# Patient Record
Sex: Female | Born: 1965 | Smoking: Never smoker
Health system: Southern US, Community
[De-identification: ages and names within clinical notes are randomized; demographics above are authoritative.]

## PROBLEM LIST (undated history)

## (undated) DIAGNOSIS — R51 Headache: Secondary | ICD-10-CM

## (undated) DIAGNOSIS — K219 Gastro-esophageal reflux disease without esophagitis: Secondary | ICD-10-CM

## (undated) DIAGNOSIS — C801 Malignant (primary) neoplasm, unspecified: Secondary | ICD-10-CM

## (undated) DIAGNOSIS — F419 Anxiety disorder, unspecified: Secondary | ICD-10-CM

## (undated) DIAGNOSIS — Z720 Tobacco use: Secondary | ICD-10-CM

## (undated) DIAGNOSIS — M1990 Unspecified osteoarthritis, unspecified site: Secondary | ICD-10-CM

## (undated) DIAGNOSIS — F329 Major depressive disorder, single episode, unspecified: Secondary | ICD-10-CM

## (undated) DIAGNOSIS — F32A Depression, unspecified: Secondary | ICD-10-CM

## (undated) DIAGNOSIS — F319 Bipolar disorder, unspecified: Secondary | ICD-10-CM

## (undated) DIAGNOSIS — Z789 Other specified health status: Secondary | ICD-10-CM

## (undated) DIAGNOSIS — R519 Headache, unspecified: Secondary | ICD-10-CM

## (undated) HISTORY — PX: ENDOMETRIAL ABLATION: SHX621

## (undated) HISTORY — PX: BUNIONECTOMY: SHX129

---

## 2017-03-27 ENCOUNTER — Other Ambulatory Visit: Payer: Self-pay | Admitting: General Surgery

## 2017-06-07 ENCOUNTER — Ambulatory Visit: Payer: Self-pay | Admitting: General Surgery

## 2017-06-07 DIAGNOSIS — C50512 Malignant neoplasm of lower-outer quadrant of left female breast: Secondary | ICD-10-CM

## 2017-06-07 DIAGNOSIS — Z170 Estrogen receptor positive status [ER+]: Principal | ICD-10-CM

## 2017-06-24 ENCOUNTER — Other Ambulatory Visit: Payer: Self-pay | Admitting: General Surgery

## 2017-06-24 ENCOUNTER — Encounter (HOSPITAL_BASED_OUTPATIENT_CLINIC_OR_DEPARTMENT_OTHER): Payer: Self-pay | Admitting: *Deleted

## 2017-06-24 DIAGNOSIS — C801 Malignant (primary) neoplasm, unspecified: Secondary | ICD-10-CM

## 2017-06-24 DIAGNOSIS — C50512 Malignant neoplasm of lower-outer quadrant of left female breast: Secondary | ICD-10-CM

## 2017-06-24 DIAGNOSIS — Z170 Estrogen receptor positive status [ER+]: Principal | ICD-10-CM

## 2017-06-24 HISTORY — DX: Malignant (primary) neoplasm, unspecified: C80.1

## 2017-06-26 ENCOUNTER — Ambulatory Visit
Admission: RE | Admit: 2017-06-26 | Discharge: 2017-06-26 | Disposition: A | Discharge status: Home / Self Care | Payer: Medicare Other | Source: Ambulatory Visit | Attending: General Surgery | Admitting: General Surgery

## 2017-06-26 ENCOUNTER — Other Ambulatory Visit: Payer: Self-pay | Admitting: General Surgery

## 2017-06-26 DIAGNOSIS — Z170 Estrogen receptor positive status [ER+]: Principal | ICD-10-CM

## 2017-06-26 DIAGNOSIS — C50512 Malignant neoplasm of lower-outer quadrant of left female breast: Secondary | ICD-10-CM

## 2017-06-26 NOTE — Progress Notes (Signed)
Ensure pre surgery drink given with instructions to complete by Andrews, hibiclens soap given with instructions.

## 2017-06-27 NOTE — Anesthesia Preprocedure Evaluation (Addendum)
Anesthesia Evaluation  Patient identified by MRN, date of birth, ID band Patient awake    Reviewed: Allergy & Precautions, NPO status , Patient's Chart, lab work & pertinent test results  History of Anesthesia Complications Negative for: history of anesthetic complications  Airway Mallampati: II  TM Distance: >3 FB Neck ROM: Full    Dental  (+) Dental Advisory Given   Pulmonary neg pulmonary ROS,    breath sounds clear to auscultation       Cardiovascular negative cardio ROS   Rhythm:Regular Rate:Normal     Neuro/Psych  Headaches, PSYCHIATRIC DISORDERS Anxiety Depression Bipolar Disorder negative neurological ROS     GI/Hepatic Neg liver ROS, GERD  Medicated and Controlled,  Endo/Other  Morbid obesity  Renal/GU negative Renal ROS     Musculoskeletal   Abdominal (+) + obese,   Peds  Hematology negative hematology ROS (+)   Anesthesia Other Findings Breast cancer  Reproductive/Obstetrics                            Anesthesia Physical Anesthesia Plan  ASA: II  Anesthesia Plan: General   Post-op Pain Management: GA combined w/ Regional for post-op pain   Induction: Intravenous  PONV Risk Score and Plan: 3 and Ondansetron, Dexamethasone, Midazolam and Scopolamine patch - Pre-op  Airway Management Planned: LMA  Additional Equipment:   Intra-op Plan:   Post-operative Plan:   Informed Consent: I have reviewed the patients History and Physical, chart, labs and discussed the procedure including the risks, benefits and alternatives for the proposed anesthesia with the patient or authorized representative who has indicated his/her understanding and acceptance.   Dental advisory given  Plan Discussed with: CRNA and Surgeon  Anesthesia Plan Comments: (Plan routine monitors, GA- LMA OK Pectoralis block for post op analgesia)        Anesthesia Quick Evaluation

## 2017-06-28 ENCOUNTER — Encounter (HOSPITAL_BASED_OUTPATIENT_CLINIC_OR_DEPARTMENT_OTHER): Payer: Self-pay | Admitting: Certified Registered"

## 2017-06-28 ENCOUNTER — Ambulatory Visit (HOSPITAL_BASED_OUTPATIENT_CLINIC_OR_DEPARTMENT_OTHER): Payer: Medicare Other | Admitting: Anesthesiology

## 2017-06-28 ENCOUNTER — Encounter (HOSPITAL_COMMUNITY)
Admission: RE | Admit: 2017-06-28 | Discharge: 2017-06-28 | Disposition: A | Discharge status: Home / Self Care | Payer: Medicare Other | Source: Ambulatory Visit | Attending: General Surgery | Admitting: General Surgery

## 2017-06-28 ENCOUNTER — Other Ambulatory Visit: Payer: Self-pay

## 2017-06-28 ENCOUNTER — Ambulatory Visit (HOSPITAL_BASED_OUTPATIENT_CLINIC_OR_DEPARTMENT_OTHER)
Admission: RE | Admit: 2017-06-28 | Discharge: 2017-06-28 | Disposition: A | Discharge status: Home / Self Care | Payer: Medicare Other | Source: Ambulatory Visit | Attending: General Surgery | Admitting: General Surgery

## 2017-06-28 ENCOUNTER — Encounter (HOSPITAL_BASED_OUTPATIENT_CLINIC_OR_DEPARTMENT_OTHER)
Admission: RE | Disposition: A | Discharge status: Home / Self Care | Payer: Self-pay | Source: Ambulatory Visit | Attending: General Surgery

## 2017-06-28 ENCOUNTER — Ambulatory Visit
Admission: RE | Admit: 2017-06-28 | Discharge: 2017-06-28 | Disposition: A | Discharge status: Home / Self Care | Payer: Medicare Other | Source: Ambulatory Visit | Attending: General Surgery | Admitting: General Surgery

## 2017-06-28 DIAGNOSIS — Z87891 Personal history of nicotine dependence: Secondary | ICD-10-CM | POA: Insufficient documentation

## 2017-06-28 DIAGNOSIS — Z170 Estrogen receptor positive status [ER+]: Principal | ICD-10-CM

## 2017-06-28 DIAGNOSIS — C50912 Malignant neoplasm of unspecified site of left female breast: Secondary | ICD-10-CM | POA: Diagnosis present

## 2017-06-28 DIAGNOSIS — Z6833 Body mass index (BMI) 33.0-33.9, adult: Secondary | ICD-10-CM | POA: Diagnosis not present

## 2017-06-28 DIAGNOSIS — F419 Anxiety disorder, unspecified: Secondary | ICD-10-CM | POA: Insufficient documentation

## 2017-06-28 DIAGNOSIS — Z79899 Other long term (current) drug therapy: Secondary | ICD-10-CM | POA: Insufficient documentation

## 2017-06-28 DIAGNOSIS — C773 Secondary and unspecified malignant neoplasm of axilla and upper limb lymph nodes: Secondary | ICD-10-CM | POA: Insufficient documentation

## 2017-06-28 DIAGNOSIS — C50512 Malignant neoplasm of lower-outer quadrant of left female breast: Secondary | ICD-10-CM

## 2017-06-28 DIAGNOSIS — K219 Gastro-esophageal reflux disease without esophagitis: Secondary | ICD-10-CM | POA: Insufficient documentation

## 2017-06-28 HISTORY — DX: Tobacco use: Z72.0

## 2017-06-28 HISTORY — DX: Other specified health status: Z78.9

## 2017-06-28 HISTORY — DX: Anxiety disorder, unspecified: F41.9

## 2017-06-28 HISTORY — PX: BREAST LUMPECTOMY WITH RADIOACTIVE SEED AND SENTINEL LYMPH NODE BIOPSY: SHX6550

## 2017-06-28 HISTORY — DX: Depression, unspecified: F32.A

## 2017-06-28 HISTORY — DX: Gastro-esophageal reflux disease without esophagitis: K21.9

## 2017-06-28 HISTORY — DX: Major depressive disorder, single episode, unspecified: F32.9

## 2017-06-28 HISTORY — DX: Unspecified osteoarthritis, unspecified site: M19.90

## 2017-06-28 HISTORY — DX: Headache: R51

## 2017-06-28 HISTORY — DX: Bipolar disorder, unspecified: F31.9

## 2017-06-28 HISTORY — DX: Headache, unspecified: R51.9

## 2017-06-28 HISTORY — DX: Malignant (primary) neoplasm, unspecified: C80.1

## 2017-06-28 LAB — POCT HEMOGLOBIN-HEMACUE: Hemoglobin: 11.7 g/dL — ABNORMAL LOW (ref 12.0–15.0)

## 2017-06-28 SURGERY — BREAST LUMPECTOMY WITH RADIOACTIVE SEED AND SENTINEL LYMPH NODE BIOPSY
Anesthesia: General | Site: Breast | Laterality: Left

## 2017-06-28 MED ORDER — EPHEDRINE SULFATE 50 MG/ML IJ SOLN
INTRAMUSCULAR | Status: DC | PRN
  Administered 2017-06-28 (×4): 10 mg via INTRAVENOUS

## 2017-06-28 MED ORDER — LIDOCAINE 2% (20 MG/ML) 5 ML SYRINGE
INTRAMUSCULAR | Status: AC
  Filled 2017-06-28: qty 10

## 2017-06-28 MED ORDER — BUPIVACAINE-EPINEPHRINE (PF) 0.25% -1:200000 IJ SOLN
INTRAMUSCULAR | Status: AC
  Filled 2017-06-28: qty 30

## 2017-06-28 MED ORDER — BUPIVACAINE-EPINEPHRINE (PF) 0.5% -1:200000 IJ SOLN
INTRAMUSCULAR | Status: DC | PRN
  Administered 2017-06-28: 30 mL

## 2017-06-28 MED ORDER — CELECOXIB 200 MG PO CAPS
200.0000 mg | ORAL_CAPSULE | ORAL | Status: AC
  Administered 2017-06-28: 200 mg via ORAL

## 2017-06-28 MED ORDER — LIDOCAINE HCL (CARDIAC) 20 MG/ML IV SOLN
INTRAVENOUS | Status: DC | PRN
  Administered 2017-06-28: 30 mg via INTRAVENOUS
  Administered 2017-06-28: 50 mg via INTRAVENOUS

## 2017-06-28 MED ORDER — SCOPOLAMINE 1 MG/3DAYS TD PT72: 1.0000 | MEDICATED_PATCH | Freq: Once | TRANSDERMAL | Status: DC | PRN

## 2017-06-28 MED ORDER — TECHNETIUM TC 99M SULFUR COLLOID FILTERED
1.0000 | Freq: Once | INTRAVENOUS | Status: AC | PRN
  Administered 2017-06-28: 1 via INTRADERMAL

## 2017-06-28 MED ORDER — PROPOFOL 10 MG/ML IV BOLUS
INTRAVENOUS | Status: DC | PRN
  Administered 2017-06-28: 200 mg via INTRAVENOUS

## 2017-06-28 MED ORDER — GABAPENTIN 300 MG PO CAPS
300.0000 mg | ORAL_CAPSULE | ORAL | Status: AC
  Administered 2017-06-28: 300 mg via ORAL

## 2017-06-28 MED ORDER — FENTANYL CITRATE (PF) 100 MCG/2ML IJ SOLN
INTRAMUSCULAR | Status: AC
  Filled 2017-06-28: qty 2

## 2017-06-28 MED ORDER — ACETAMINOPHEN 500 MG PO TABS
1000.0000 mg | ORAL_TABLET | ORAL | Status: AC
  Administered 2017-06-28: 1000 mg via ORAL

## 2017-06-28 MED ORDER — DEXAMETHASONE SODIUM PHOSPHATE 4 MG/ML IJ SOLN
INTRAMUSCULAR | Status: DC | PRN
  Administered 2017-06-28: 10 mg via INTRAVENOUS

## 2017-06-28 MED ORDER — FENTANYL CITRATE (PF) 100 MCG/2ML IJ SOLN
50.0000 ug | INTRAMUSCULAR | Status: DC | PRN
  Administered 2017-06-28: 100 ug via INTRAVENOUS

## 2017-06-28 MED ORDER — MIDAZOLAM HCL 2 MG/2ML IJ SOLN: 0.5000 mg | Freq: Once | INTRAMUSCULAR | Status: DC | PRN

## 2017-06-28 MED ORDER — FENTANYL CITRATE (PF) 100 MCG/2ML IJ SOLN
INTRAMUSCULAR | Status: DC | PRN
  Administered 2017-06-28: 50 ug via INTRAVENOUS

## 2017-06-28 MED ORDER — CEFAZOLIN SODIUM-DEXTROSE 2-4 GM/100ML-% IV SOLN
2.0000 g | INTRAVENOUS | Status: AC
  Administered 2017-06-28 (×2): 2 g via INTRAVENOUS

## 2017-06-28 MED ORDER — MIDAZOLAM HCL 2 MG/2ML IJ SOLN
INTRAMUSCULAR | Status: AC
  Filled 2017-06-28: qty 2

## 2017-06-28 MED ORDER — BUPIVACAINE HCL (PF) 0.25 % IJ SOLN
INTRAMUSCULAR | Status: DC | PRN
  Administered 2017-06-28: 7 mL

## 2017-06-28 MED ORDER — BUPIVACAINE HCL (PF) 0.25 % IJ SOLN
INTRAMUSCULAR | Status: AC
  Filled 2017-06-28: qty 30

## 2017-06-28 MED ORDER — PROPOFOL 500 MG/50ML IV EMUL
INTRAVENOUS | Status: AC
  Filled 2017-06-28: qty 50

## 2017-06-28 MED ORDER — ONDANSETRON HCL 4 MG/2ML IJ SOLN
INTRAMUSCULAR | Status: DC | PRN
  Administered 2017-06-28: 4 mg via INTRAVENOUS

## 2017-06-28 MED ORDER — ONDANSETRON HCL 4 MG/2ML IJ SOLN
INTRAMUSCULAR | Status: AC
  Filled 2017-06-28: qty 8

## 2017-06-28 MED ORDER — MEPERIDINE HCL 25 MG/ML IJ SOLN: 6.2500 mg | INTRAMUSCULAR | Status: DC | PRN

## 2017-06-28 MED ORDER — LACTATED RINGERS IV SOLN
INTRAVENOUS | Status: DC
  Administered 2017-06-28 (×2): via INTRAVENOUS

## 2017-06-28 MED ORDER — ENSURE PRE-SURGERY PO LIQD: 592.0000 mL | Freq: Once | ORAL | Status: DC

## 2017-06-28 MED ORDER — OXYCODONE HCL 5 MG PO TABS: 5.0000 mg | ORAL_TABLET | Freq: Four times a day (QID) | ORAL | 0 refills | Status: AC | PRN

## 2017-06-28 MED ORDER — DEXAMETHASONE SODIUM PHOSPHATE 10 MG/ML IJ SOLN
INTRAMUSCULAR | Status: AC
  Filled 2017-06-28: qty 2

## 2017-06-28 MED ORDER — SODIUM CHLORIDE 0.9 % IJ SOLN
INTRAMUSCULAR | Status: AC
  Filled 2017-06-28: qty 10

## 2017-06-28 MED ORDER — MIDAZOLAM HCL 2 MG/2ML IJ SOLN
1.0000 mg | INTRAMUSCULAR | Status: DC | PRN
  Administered 2017-06-28: 2 mg via INTRAVENOUS

## 2017-06-28 MED ORDER — FENTANYL CITRATE (PF) 100 MCG/2ML IJ SOLN: 25.0000 ug | INTRAMUSCULAR | Status: DC | PRN

## 2017-06-28 MED ORDER — EPHEDRINE 5 MG/ML INJ
INTRAVENOUS | Status: AC
  Filled 2017-06-28: qty 20

## 2017-06-28 MED ORDER — PROMETHAZINE HCL 25 MG/ML IJ SOLN: 6.2500 mg | INTRAMUSCULAR | Status: DC | PRN

## 2017-06-28 MED ORDER — HEMOSTATIC AGENTS (NO CHARGE) OPTIME
TOPICAL | Status: DC | PRN
  Administered 2017-06-28: 1 via TOPICAL

## 2017-06-28 MED ORDER — METHYLENE BLUE 0.5 % INJ SOLN
INTRAVENOUS | Status: AC
  Filled 2017-06-28: qty 10

## 2017-06-28 SURGICAL SUPPLY — 61 items
APPLIER CLIP 9.375 MED OPEN (MISCELLANEOUS) ×3
BINDER BREAST LRG (GAUZE/BANDAGES/DRESSINGS) IMPLANT
BINDER BREAST MEDIUM (GAUZE/BANDAGES/DRESSINGS) IMPLANT
BINDER BREAST XLRG (GAUZE/BANDAGES/DRESSINGS) ×3 IMPLANT
BINDER BREAST XXLRG (GAUZE/BANDAGES/DRESSINGS) IMPLANT
BLADE SURG 15 STRL LF DISP TIS (BLADE) ×1 IMPLANT
BLADE SURG 15 STRL SS (BLADE) ×2
CANISTER SUC SOCK COL 7IN (MISCELLANEOUS) IMPLANT
CANISTER SUCT 1200ML W/VALVE (MISCELLANEOUS) ×3 IMPLANT
CHLORAPREP W/TINT 26ML (MISCELLANEOUS) ×3 IMPLANT
CLIP APPLIE 9.375 MED OPEN (MISCELLANEOUS) ×1 IMPLANT
CLIP VESOCCLUDE SM WIDE 6/CT (CLIP) ×3 IMPLANT
CLOSURE WOUND 1/2 X4 (GAUZE/BANDAGES/DRESSINGS) ×1
COVER BACK TABLE 60X90IN (DRAPES) ×3 IMPLANT
COVER MAYO STAND STRL (DRAPES) ×3 IMPLANT
COVER PROBE W GEL 5X96 (DRAPES) ×3 IMPLANT
DECANTER SPIKE VIAL GLASS SM (MISCELLANEOUS) IMPLANT
DERMABOND ADVANCED (GAUZE/BANDAGES/DRESSINGS) ×2
DERMABOND ADVANCED .7 DNX12 (GAUZE/BANDAGES/DRESSINGS) ×1 IMPLANT
DEVICE DUBIN W/COMP PLATE 8390 (MISCELLANEOUS) ×3 IMPLANT
DRAPE LAPAROSCOPIC ABDOMINAL (DRAPES) ×3 IMPLANT
DRAPE UTILITY XL STRL (DRAPES) ×3 IMPLANT
ELECT COATED BLADE 2.86 ST (ELECTRODE) ×3 IMPLANT
ELECT REM PT RETURN 9FT ADLT (ELECTROSURGICAL) ×3
ELECTRODE REM PT RTRN 9FT ADLT (ELECTROSURGICAL) ×1 IMPLANT
GLOVE BIO SURGEON STRL SZ7 (GLOVE) ×9 IMPLANT
GLOVE BIOGEL PI IND STRL 7.5 (GLOVE) ×1 IMPLANT
GLOVE BIOGEL PI INDICATOR 7.5 (GLOVE) ×2
GLOVE EXAM NITRILE EXT CUFF MD (GLOVE) ×3 IMPLANT
GOWN STRL REUS W/ TWL LRG LVL3 (GOWN DISPOSABLE) ×1 IMPLANT
GOWN STRL REUS W/ TWL XL LVL3 (GOWN DISPOSABLE) ×1 IMPLANT
GOWN STRL REUS W/TWL LRG LVL3 (GOWN DISPOSABLE) ×2
GOWN STRL REUS W/TWL XL LVL3 (GOWN DISPOSABLE) ×2
HEMOSTAT ARISTA ABSORB 3G PWDR (MISCELLANEOUS) ×3 IMPLANT
ILLUMINATOR WAVEGUIDE N/F (MISCELLANEOUS) ×3 IMPLANT
KIT MARKER MARGIN INK (KITS) ×3 IMPLANT
LIGHT WAVEGUIDE WIDE FLAT (MISCELLANEOUS) IMPLANT
NDL SAFETY ECLIPSE 18X1.5 (NEEDLE) IMPLANT
NEEDLE HYPO 18GX1.5 SHARP (NEEDLE)
NEEDLE HYPO 25X1 1.5 SAFETY (NEEDLE) ×3 IMPLANT
NS IRRIG 1000ML POUR BTL (IV SOLUTION) IMPLANT
PACK BASIN DAY SURGERY FS (CUSTOM PROCEDURE TRAY) ×3 IMPLANT
PENCIL BUTTON HOLSTER BLD 10FT (ELECTRODE) ×3 IMPLANT
SLEEVE SCD COMPRESS KNEE MED (MISCELLANEOUS) ×3 IMPLANT
SPONGE LAP 4X18 X RAY DECT (DISPOSABLE) ×3 IMPLANT
STRIP CLOSURE SKIN 1/2X4 (GAUZE/BANDAGES/DRESSINGS) ×2 IMPLANT
SUT ETHILON 2 0 FS 18 (SUTURE) IMPLANT
SUT MNCRL AB 4-0 PS2 18 (SUTURE) ×6 IMPLANT
SUT MON AB 5-0 PS2 18 (SUTURE) IMPLANT
SUT SILK 2 0 SH (SUTURE) ×3 IMPLANT
SUT VIC AB 2-0 SH 27 (SUTURE) ×4
SUT VIC AB 2-0 SH 27XBRD (SUTURE) ×2 IMPLANT
SUT VIC AB 3-0 SH 27 (SUTURE) ×2
SUT VIC AB 3-0 SH 27X BRD (SUTURE) ×1 IMPLANT
SUT VIC AB 5-0 PS2 18 (SUTURE) IMPLANT
SYR CONTROL 10ML LL (SYRINGE) ×3 IMPLANT
TOWEL OR 17X24 6PK STRL BLUE (TOWEL DISPOSABLE) ×3 IMPLANT
TOWEL OR NON WOVEN STRL DISP B (DISPOSABLE) ×3 IMPLANT
TUBE CONNECTING 20'X1/4 (TUBING) ×1
TUBE CONNECTING 20X1/4 (TUBING) ×2 IMPLANT
YANKAUER SUCT BULB TIP NO VENT (SUCTIONS) ×3 IMPLANT

## 2017-06-28 NOTE — Anesthesia Postprocedure Evaluation (Signed)
Anesthesia Post Note  Patient: Haley Webb  Procedure(s) Performed: BREAST LUMPECTOMY WITH RADIOACTIVE SEED AND SENTINEL LYMPH NODE BIOPSY (Left Breast)     Patient location during evaluation: PACU Anesthesia Type: General Level of consciousness: awake and alert, patient cooperative and oriented Pain management: pain level controlled Vital Signs Assessment: post-procedure vital signs reviewed and stable Respiratory status: spontaneous breathing, nonlabored ventilation and respiratory function stable Cardiovascular status: blood pressure returned to baseline and stable Postop Assessment: no apparent nausea or vomiting and adequate PO intake Anesthetic complications: no    Last Vitals:  Vitals:   06/28/17 1026 06/28/17 1030  BP:  102/78  Pulse: (!) 105 (!) 106  Resp: 17 16  Temp:  37.1 C  SpO2: 93% 95%    Last Pain:  Vitals:   06/28/17 1030  TempSrc:   PainSc: 0-No pain                 Betul Brisky,E. Zola Runion

## 2017-06-28 NOTE — H&P (Signed)
51 yof referred by Dr Darrol Angel for newly diagnosed left breast cancer. She has bipolar and sees a psychiatrist regularly. she has had bilateral nipple dc in past that is better with change in medications. she has no personal or family history of breast disease. she underwent screening mm that shows an asymmetry in the left breast. on callback she was noted to have an inferolateral architectural distortion and superomedial she was noted to have a 1.6 cm mass. on [place] the 4 oclock lesion is a 4x5x3 mm mass and at 11 oclock there is a 1.7x0.8x1.1 cm mass. the left axillary nodes are negative. US biopsy of both masses was done. 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 the other biopsy is fibrocystic changes with prominent cyst and microcyst formation. she has since also undergone an mri that shows normal left axillary nodes, the biopsy proven cancer in loq with little enhancement at that site, nl right breast and nodes. she has since seen a couple surgeons, med onc and rad onc. she requested another opinion and is here today by herself she is on disability. she live with a roommate she is trying to have leave now. she has two sons who are near who help her. she states her psych status is pretty stable. she is very concerned about breast appearance moving forward   Past Surgical History Illene Regulus, CMA; 06/07/2017 3:05 PM) Breast Biopsy  Left. Cesarean Section - 1  Foot Surgery  Bilateral.  Diagnostic Studies History Illene Regulus, CMA; 06/07/2017 3:05 PM) Colonoscopy  never Mammogram  1-3 years ago  Allergies Malachy Moan, RMA; 06/07/2017 10:05 AM) No Known Allergies 06/07/2017  Medication History Malachy Moan, RMA; 06/07/2017 10:08 AM) CeleXA (Oral) Specific strength unknown - Active. HydroCHLOROthiazide (Oral) Specific strength unknown - Active. ClonazePAM (Oral) Specific strength unknown - Active. Doxepin HCl  (Oral) Specific strength unknown - Active. Ranitidine (Oral) Specific strength unknown - Active. PriLOSEC (Oral) Specific strength unknown - Active. Medications Reconciled  Social History Illene Regulus, CMA; 06/07/2017 3:05 PM) Alcohol use  Occasional alcohol use. Caffeine use  Carbonated beverages, Coffee. No drug use  Tobacco use  Former smoker.  Family History Illene Regulus, CMA; 06/07/2017 3:05 PM) Family history unknown  First Degree Relatives   Pregnancy / Birth History Illene Regulus, CMA; 06/07/2017 3:05 PM) Age at menarche  51 years. Age of menopause  <45 Gravida  2 Maternal age  45-25 Para  2  Other Problems Illene Regulus, CMA; 06/07/2017 3:05 PM) Anxiety Disorder  Arthritis  Breast Cancer  Depression  Gastroesophageal Reflux Disease  High blood pressure  Migraine Headache  Thyroid Disease     Review of Systems (Alisha Spillers CMA; 06/07/2017 3:05 PM) General Present- Fatigue, Night Sweats and Weight Gain. Not Present- Appetite Loss, Chills, Fever and Weight Loss. HEENT Present- Hoarseness and Seasonal Allergies. Not Present- Earache, Hearing Loss, Nose Bleed, Oral Ulcers, Ringing in the Ears, Sinus Pain, Sore Throat, Visual Disturbances, Wears glasses/contact lenses and Yellow Eyes. Breast Present- Breast Mass. Not Present- Breast Pain, Nipple Discharge and Skin Changes. Cardiovascular Present- Leg Cramps and Shortness of Breath. Not Present- Chest Pain, Difficulty Breathing Lying Down, Palpitations, Rapid Heart Rate and Swelling of Extremities. Gastrointestinal Present- Bloating, Constipation, Difficulty Swallowing and Indigestion. Not Present- Abdominal Pain, Bloody Stool, Change in Bowel Habits, Chronic diarrhea, Excessive gas, Gets full quickly at meals, Hemorrhoids, Nausea, Rectal Pain and Vomiting. Female Genitourinary Present- Frequency. Not Present- Nocturia, Painful Urination,  Pelvic Pain and Urgency. Neurological Present-  Headaches and Weakness. Not Present- Decreased Memory, Fainting, Numbness, Seizures, Tingling, Tremor and Trouble walking. Psychiatric Present- Anxiety, Bipolar and Depression. Not Present- Change in Sleep Pattern, Fearful and Frequent crying. Endocrine Present- Hot flashes. Not Present- Cold Intolerance, Excessive Hunger, Hair Changes, Heat Intolerance and New Diabetes.  Vitals Malachy Moan RMA; 06/07/2017 10:10 AM) 06/07/2017 10:10 AM Weight: 193 lb Height: 64in Body Surface Area: 1.93 m Body Mass Index: 33.13 kg/m  Temp.: 98.66F  Pulse: 100 (Regular)  BP: 120/76 (Sitting, Left Arm, Standard) Physical Exam Rolm Bookbinder MD; 06/07/2017 2:05 PM) General Mental Status-Alert. General Appearance-Lethargic / slow, Not Anxious. Orientation-Oriented X3.  Head and Neck Trachea-midline. Thyroid Gland Characteristics - normal size and consistency.  Eye Sclera/Conjunctiva - Bilateral-No scleral icterus.  Chest and Lung Exam Chest and lung exam reveals -quiet, even and easy respiratory effort with no use of accessory muscles and on auscultation, normal breath sounds, no adventitious sounds and normal vocal resonance.  Breast Nipples-No Discharge. Breast Lump-No Palpable Breast Mass.  Cardiovascular Cardiovascular examination reveals -normal heart sounds, regular rate and rhythm with no murmurs and no digital clubbing, cyanosis, edema, increased warmth or tenderness.  Abdomen Note: soft no hepatomegaly   Lymphatic Head & Neck  General Head & Neck Lymphatics: Bilateral - Description - Normal. Axillary  General Axillary Region: Bilateral - Description - Normal. Note: no [place] adenopathy   Assessment & Plan Rolm Bookbinder MD; 06/07/2017 11:37 AM) BREAST CANCER OF LOWER-OUTER QUADRANT OF LEFT FEMALE BREAST (C50.512) Story: Left breast seed guided lumpectomy, left axillary sentinel node biopsy We discussed the staging and pathophysiology  of breast cancer. We discussed all of the different options for treatment for breast cancer including surgery, chemotherapy, radiation therapy, Herceptin, and antiestrogen therapy. We discussed a sentinel lymph node biopsy as she does not appear to having lymph node involvement right now. We discussed the performance of that with injection of radioactive tracer. We discussed that there is a chance of having a positive node with a sentinel lymph node biopsy and we will await the permanent pathology to make any other first further decisions in terms of her treatment. One of these options might be to return to the operating room to perform an axillary lymph node dissection. We discussed up to a 5% risk lifetime of chronic shoulder pain as well as lymphedema associated with a sentinel lymph node biopsy. We discussed the options for treatment of the breast cancer which included lumpectomy versus a mastectomy. We discussed the performance of the lumpectomy with radioactive seed placement. We discussed a 5-10% chance of a positive margin requiring reexcision in the operating room. We also discussed that she will need radiation therapy if she undergoes lumpectomy. We discussed the mastectomy (removal of whole breast) and the postoperative care for that as well. Mastectomy can be followed by reconstruction. The decision for lumpectomy vs mastectomy has no impact on decision for chemotherapy. Most mastectomy patients will not need radiation therapy. We discussed that there is no difference in her survival whether she undergoes lumpectomy with radiation therapy or antiestrogen therapy versus a mastectomy. There is also no real difference between her recurrence in the breast. She understands her further therapy will be based on what her stages at the time of her operation. I dont think will have poor cosmetic result with lumpectomy as long as margins cl

## 2017-06-28 NOTE — Progress Notes (Signed)
Assisted Dr. Carswell Jackson with left, ultrasound guided, pectoralis block. Side rails up, monitors on throughout procedure. See vital signs in flow sheet. Tolerated Procedure well. 

## 2017-06-28 NOTE — Anesthesia Procedure Notes (Signed)
Anesthesia Regional Block: Pectoralis block   Pre-Anesthetic Checklist: ,, timeout performed, Correct Patient, Correct Site, Correct Laterality, Correct Procedure, Correct Position, site marked, Risks and benefits discussed,  Surgical consent,  Pre-op evaluation,  At surgeon's request and post-op pain management  Laterality: Left  Prep: chloraprep       Needles:  Injection technique: Single-shot  Needle Type: Echogenic Needle     Needle Length: 9cm  Needle Gauge: 21     Additional Needles:   Procedures:,,,, ultrasound used (permanent image in chart),,,,  Narrative:  Start time: 06/28/2017 7:55 AM End time: 06/28/2017 8:03 AM Injection made incrementally with aspirations every 5 mL.  Performed by: Personally  Anesthesiologist: Annye Asa, MD  Additional Notes: Pt identified in Holding room.  Monitors applied. Working IV access confirmed. Sterile prep, drape L clavicle and pec.  #21ga ECHOgenic needle between pec minor and serratus ribs 4,5.  30cc 0.5% Bupivacaine with 1:200k epi injected incrementally after negative test dose, good spread across ribs.  Patient asymptomatic, VSS, no heme aspirated, tolerated well.  Jenita Seashore, MD

## 2017-06-28 NOTE — Interval H&P Note (Signed)
History and Physical Interval Note:  06/28/2017 8:16 AM  Haley Webb  has presented today for surgery, with the diagnosis of LEFT BREAST CANCER  The various methods of treatment have been discussed with the patient and family. After consideration of risks, benefits and other options for treatment, the patient has consented to  Procedure(s): BREAST LUMPECTOMY WITH RADIOACTIVE SEED AND SENTINEL LYMPH NODE BIOPSY (Left) as a surgical intervention .  The patient's history has been reviewed, patient examined, no change in status, stable for surgery.  I have reviewed the patient's chart and labs.  Questions were answered to the patient's satisfaction.     Haley Webb

## 2017-06-28 NOTE — Interval H&P Note (Signed)
History and Physical Interval Note:  06/28/2017 8:12 AM  Haley Webb  has presented today for surgery, with the diagnosis of LEFT BREAST CANCER  The various methods of treatment have been discussed with the patient and family. After consideration of risks, benefits and other options for treatment, the patient has consented to  Procedure(s): BREAST LUMPECTOMY WITH RADIOACTIVE SEED AND SENTINEL LYMPH NODE BIOPSY (Left) as a surgical intervention .  The patient's history has been reviewed, patient examined, no change in status, stable for surgery.  I have reviewed the patient's chart and labs.  Questions were answered to the patient's satisfaction.     Teryn Boerema

## 2017-06-28 NOTE — Transfer of Care (Signed)
Immediate Anesthesia Transfer of Care Note  Patient: Haley Webb  Procedure(s) Performed: BREAST LUMPECTOMY WITH RADIOACTIVE SEED AND SENTINEL LYMPH NODE BIOPSY (Left Breast)  Patient Location: PACU  Anesthesia Type:GA combined with regional for post-op pain  Level of Consciousness: awake and patient cooperative  Airway & Oxygen Therapy: Patient Spontanous Breathing and Patient connected to face mask oxygen  Post-op Assessment: Report given to RN and Post -op Vital signs reviewed and stable  Post vital signs: Reviewed and stable  Last Vitals:  Vitals:   06/28/17 0800 06/28/17 0805  BP: 134/70 138/72  Pulse: 79 83  Resp: 15 14  Temp:    SpO2: 99% 100%    Last Pain:  Vitals:   06/28/17 0704  TempSrc: Oral         Complications: No apparent anesthesia complications

## 2017-06-28 NOTE — Discharge Instructions (Signed)
[facility] °Office Phone Number [phone] ° °POST OP INSTRUCTIONS ° °Always review your discharge instruction sheet given to you by the facility where your surgery was performed. ° °IF YOU HAVE DISABILITY OR FAMILY LEAVE FORMS, YOU MUST BRING THEM TO THE OFFICE FOR PROCESSING.  DO NOT GIVE THEM TO YOUR DOCTOR. ° °1. A prescription for pain medication may be given to you upon discharge.  Take your pain medication as prescribed, if needed.  If narcotic pain medicine is not needed, then you may take acetaminophen (Tylenol), naprosyn (Alleve) or ibuprofen (Advil) as needed. °2. Take your usually prescribed medications unless otherwise directed °3. If you need a refill on your pain medication, please contact your pharmacy.  They will contact our office to request authorization.  Prescriptions will not be filled after 5pm or on week-ends. °4. You should eat very light the first 24 hours after surgery, such as soup, crackers, pudding, etc.  Resume your normal diet the day after surgery. °5. Most patients will experience some swelling and bruising in the breast.  Ice packs and a good support bra will help.  Wear the breast binder provided or a sports bra for 72 hours day and night.  After that wear a sports bra during the day until you return to the office. Swelling and bruising can take several days to resolve.  °6. It is common to experience some constipation if taking pain medication after surgery.  Increasing fluid intake and taking a stool softener will usually help or prevent this problem from occurring.  A mild laxative (Milk of Magnesia or Miralax) should be taken according to package directions if there are no bowel movements after 48 hours. °7. Unless discharge instructions indicate otherwise, you may remove your bandages 48 hours after surgery and you may shower at that time.  You may have steri-strips (small skin tapes) in place directly over the incision.  These strips should be left on the  skin for 7-10 days and will come off on their own.  If your surgeon used skin glue on the incision, you may shower in 24 hours.  The glue will flake off over the next 2-3 weeks.  Any sutures or staples will be removed at the office during your follow-up visit. °8. ACTIVITIES:  You may resume regular daily activities (gradually increasing) beginning the next day.  Wearing a good support bra or sports bra minimizes pain and swelling.  You may have sexual intercourse when it is comfortable. °a. You may drive when you no longer are taking prescription pain medication, you can comfortably wear a seatbelt, and you can safely maneuver your car and apply brakes. °b. RETURN TO WORK:  ______________________________________________________________________________________ °9. You should see your doctor in the office for a follow-up appointment approximately two weeks after your surgery.  Your doctor’s nurse will typically make your follow-up appointment when she calls you with your pathology report.  Expect your pathology report 3-4 business days after your surgery.  You may call to check if you do not hear from us after three days. °10. OTHER INSTRUCTIONS: _______________________________________________________________________________________________ _____________________________________________________________________________________________________________________________________ °_____________________________________________________________________________________________________________________________________ °_____________________________________________________________________________________________________________________________________ ° °WHEN TO CALL DR WAKEFIELD: °1. Fever over 101.0 °2. Nausea and/or vomiting. °3. Extreme swelling or bruising. °4. Continued bleeding from incision. °5. Increased pain, redness, or drainage from the incision. ° °The clinic staff is available to answer your questions during regular  business hours.  Please don’t hesitate to call and ask to speak to one of the nurses for clinical concerns.  If   you have a medical emergency, go to the nearest emergency room or call 911.  A surgeon from [facility] is always on call at the hospital. ° °For further questions, please visit [email or web address] ° ° ° ° ° °Post Anesthesia Home Care Instructions ° °Activity: °Get plenty of rest for the remainder of the day. A responsible individual must stay with you for 24 hours following the procedure.  °For the next 24 hours, DO NOT: °-Drive a car °-Operate machinery °-Drink alcoholic beverages °-Take any medication unless instructed by your physician °-Make any legal decisions or sign important papers. ° °Meals: °Start with liquid foods such as gelatin or soup. Progress to regular foods as tolerated. Avoid greasy, spicy, heavy foods. If nausea and/or vomiting occur, drink only clear liquids until the nausea and/or vomiting subsides. Call your physician if vomiting continues. ° °Special Instructions/Symptoms: °Your throat may feel dry or sore from the anesthesia or the breathing tube placed in your throat during surgery. If this causes discomfort, gargle with warm salt water. The discomfort should disappear within 24 hours. ° °If you had a scopolamine patch placed behind your ear for the management of post- operative nausea and/or vomiting: ° °1. The medication in the patch is effective for 72 hours, after which it should be removed.  Wrap patch in a tissue and discard in the trash. Wash hands thoroughly with soap and water. °2. You may remove the patch earlier than 72 hours if you experience unpleasant side effects which may include dry mouth, dizziness or visual disturbances. °3. Avoid touching the patch. Wash your hands with soap and water after contact with the patch. °  ° °

## 2017-06-28 NOTE — Progress Notes (Signed)
Nuc med performed nuc med inj. Pt tol well. No additional sedation required. Will call family to bedside and update/provide emotional suppport.

## 2017-06-28 NOTE — Op Note (Signed)
Preoperative diagnosis: Left breast cancer, clinical stage I Postoperative diagnosis: same as above Procedure: Leftbreast seed guided lumpectomy Leftdeep axillary sentinel node biopsy Surgeon: Dr Serita Grammes NUU:[id number] Anes: general  Specimens  1. Left breast tissue marked with paint 2. Leftaxillary sentinel nodes with highest [id number] 3. Additional left breast anterior, medial, lateral and superior margins marked short superior long lateral double deep Complications none Drains none Sponge count correct Dispo to pacu stable  Indications: This is a26 yof with a newly diagnosed clinical stage I left breast cancer. We discussed options and have elected to proceed with seed guided lumpectomy and sentinel node biopsy.   Procedure: After informed consent was obtained the patient was taken to the operating room. She first was given technetium in standard periareolar fashion. She had a pectoral block. She was given antibiotics. Sequential compression devices were on her legs. She was then placed under general anesthesia with an LMA. Then she was prepped and draped in the standard sterile surgical fashion. Surgical timeout was then performed.  I then located the seed in thelower central left breastI infiltrated marcaine in the skin and then made an inframammary incision in an attempt to hide the scar later.I then used the neoprobe to remove the seed and the surrounding tissue with attempt to get clear margins. I marked this with paint. MM confirmed removal of seed and theclip.I placed clips in the cavity.I then obtained hemostasis. I did remove some more margins as the seed counts were higher there. This was marked as above.  I approximated the breast tissue with 2-0 vicryl. The skin was closed with 3-0 vicryl and 4-0 monocryl. Glue and steristrips were applied. I then infiltrated marcaine in the low axilla and made an incision below the hairline.Icarried this through the  axillary fascia.I excised all radioactive nodes. There were no enlarged nodes. The background radioactivity was zero. I then obtained hemostasis. I closed the axillary fascia with 2-0 vicryl. The skin was closed with 3-0 vicryl and 4-0 monocryl. Glue and steristrips were applied. A binder was placed. She was extubated and transferred to PACU.

## 2017-06-28 NOTE — Anesthesia Procedure Notes (Signed)
Procedure Name: LMA Insertion Date/Time: 06/28/2017 8:26 AM Performed by: Signe Colt, CRNA Pre-anesthesia Checklist: Patient identified, Emergency Drugs available, Suction available and Patient being monitored Patient Re-evaluated:Patient Re-evaluated prior to induction Oxygen Delivery Method: Circle system utilized Preoxygenation: Pre-oxygenation with 100% oxygen Induction Type: IV induction Ventilation: Mask ventilation without difficulty LMA: LMA inserted LMA Size: 4.0 Number of attempts: 1 Airway Equipment and Method: Bite block Placement Confirmation: positive ETCO2 Tube secured with: Tape Dental Injury: Teeth and Oropharynx as per pre-operative assessment

## 2017-07-01 ENCOUNTER — Encounter (HOSPITAL_BASED_OUTPATIENT_CLINIC_OR_DEPARTMENT_OTHER): Payer: Self-pay | Admitting: General Surgery

## 2017-09-03 ENCOUNTER — Encounter (HOSPITAL_COMMUNITY): Payer: Self-pay

## 2018-01-09 IMAGING — US US PLC BREAST LOC DEV 1ST LESION  INC US GUIDE*L*
1 series · 4 of 4 positions shown · non-contrast
Comparison: Previous exam(s).

CLINICAL DATA: 51-year-old female presents for ultrasound-guided
seed localization of recently diagnosed malignancy in the left
breast at the 4 o'clock position.

EXAM:
ULTRASOUND GUIDED RADIOACTIVE SEED LOCALIZATION OF THE LEFT BREAST

[Series 1: us plc breast loc dev 1st lesion inc us guide*left · 0.07mm/px · 4 of 4 slices shown]
[im 1/4]
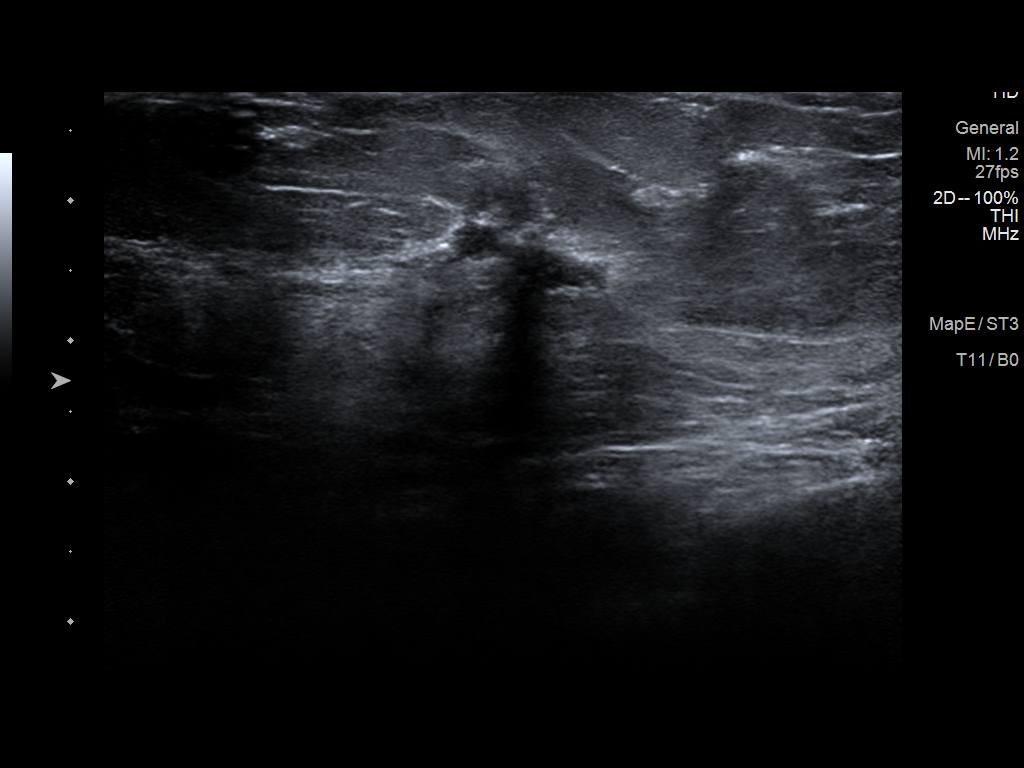
[im 2/4]
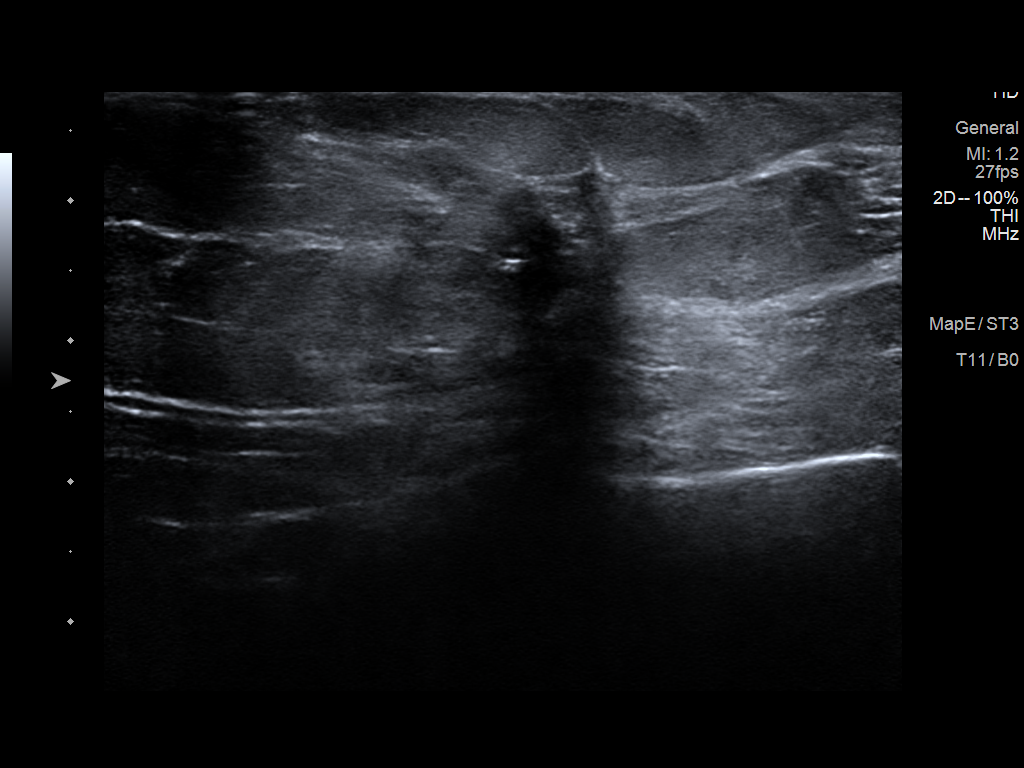
[im 3/4]
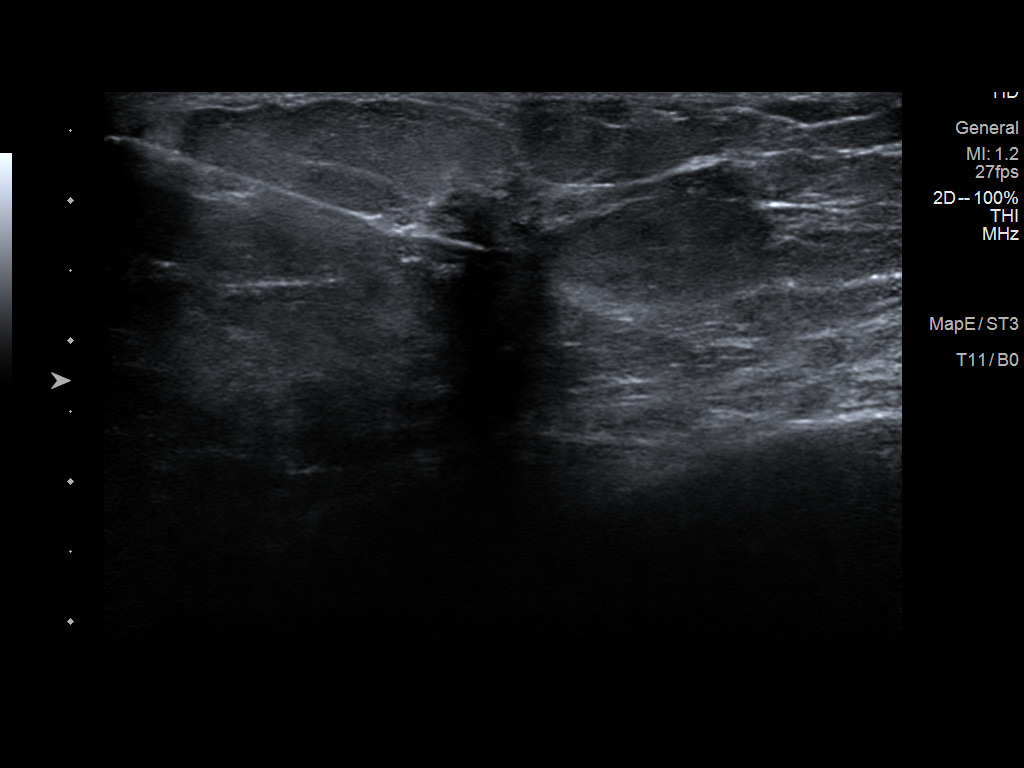
[im 4/4]
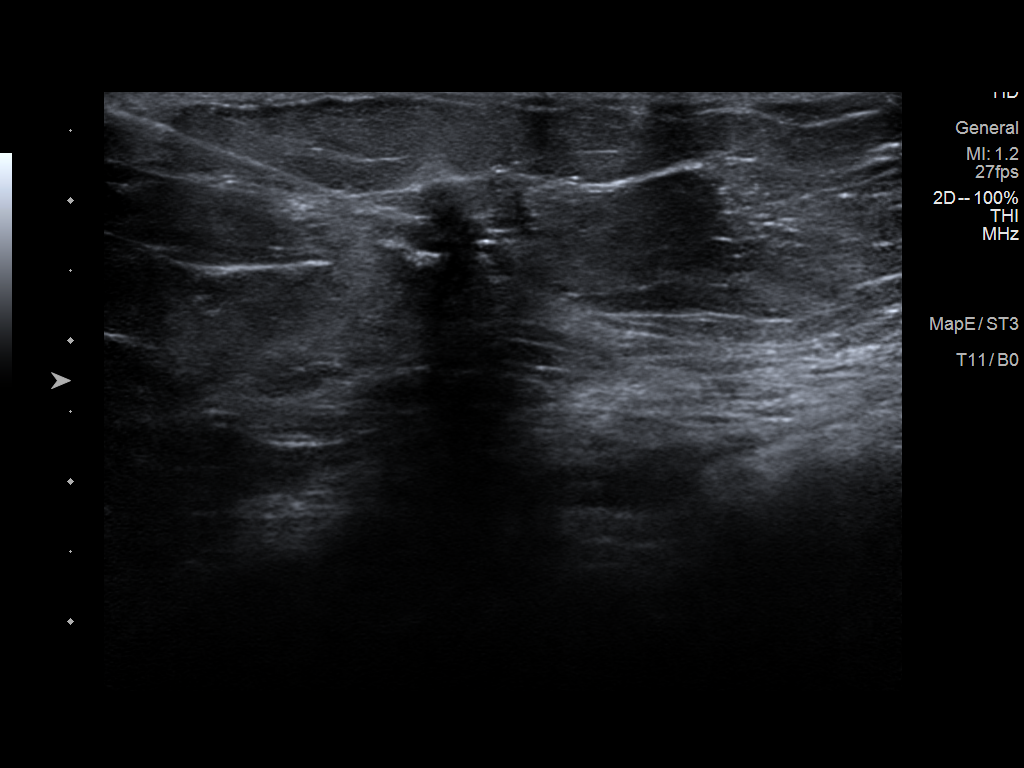

[4 of 4 positions shown; findings below may reference images not displayed]

FINDINGS: Patient presents for radioactive seed localization prior to left
breast lumpectomy. I met with the patient and we discussed the
procedure of seed localization including benefits and alternatives.
We discussed the high likelihood of a successful procedure. We
discussed the risks of the procedure including infection, bleeding,
tissue injury and further surgery. We discussed the low dose of
radioactivity involved in the procedure. Informed, written consent
was given.

The usual time-out protocol was performed immediately prior to the
procedure.

Using ultrasound guidance, sterile technique, 1% lidocaine and an
O-45Y radioactive seed, the mass with associated biopsy marking clip
in the left breast at the 4 o'clock position was localized using a
medial to lateral approach. The follow-up mammogram images confirm
the seed in the expected location and were marked for Dr. Abukawa.

Follow-up survey of the patient confirms presence of the radioactive
seed.

Order number of O-[id number] seed:  [id number].

Total activity: 0.252 millicuries Reference Date: 06/06/2017

The patient tolerated the procedure well and was released from the
[REDACTED]. She was given instructions regarding seed removal.
IMPRESSION: Radioactive seed localization left breast. No apparent
complications.
